# Patient Record
Sex: Male | Born: 1980 | Race: Black or African American | Hispanic: No | Marital: Single | Smoking: Current every day smoker
Health system: Southern US, Community
[De-identification: ages and names within clinical notes are randomized; demographics above are authoritative.]

## PROBLEM LIST (undated history)

## (undated) HISTORY — PX: FOOT SURGERY: SHX648

---

## 2012-06-09 ENCOUNTER — Emergency Department: Payer: Self-pay | Admitting: Emergency Medicine

## 2015-06-11 ENCOUNTER — Encounter (HOSPITAL_COMMUNITY): Payer: Self-pay | Admitting: Emergency Medicine

## 2015-06-11 ENCOUNTER — Emergency Department (HOSPITAL_COMMUNITY)
Admission: EM | Admit: 2015-06-11 | Discharge: 2015-06-12 | Disposition: A | Discharge status: Home / Self Care | Payer: Self-pay | Attending: Emergency Medicine | Admitting: Emergency Medicine

## 2015-06-11 ENCOUNTER — Emergency Department (HOSPITAL_COMMUNITY): Payer: Self-pay

## 2015-06-11 DIAGNOSIS — W228XXA Striking against or struck by other objects, initial encounter: Secondary | ICD-10-CM | POA: Insufficient documentation

## 2015-06-11 DIAGNOSIS — Z720 Tobacco use: Secondary | ICD-10-CM | POA: Insufficient documentation

## 2015-06-11 DIAGNOSIS — Y998 Other external cause status: Secondary | ICD-10-CM | POA: Insufficient documentation

## 2015-06-11 DIAGNOSIS — Y9289 Other specified places as the place of occurrence of the external cause: Secondary | ICD-10-CM | POA: Insufficient documentation

## 2015-06-11 DIAGNOSIS — S6992XA Unspecified injury of left wrist, hand and finger(s), initial encounter: Secondary | ICD-10-CM | POA: Insufficient documentation

## 2015-06-11 DIAGNOSIS — Y9389 Activity, other specified: Secondary | ICD-10-CM | POA: Insufficient documentation

## 2015-06-11 NOTE — ED Notes (Signed)
Patient transported to X-ray 

## 2015-06-11 NOTE — ED Notes (Signed)
Pt. presents with proximal left thumb pain/swelling injured after he accidentally hit it against a wall this evening .

## 2015-06-12 MED ORDER — HYDROCODONE-ACETAMINOPHEN 5-325 MG PO TABS: 1.0000 | ORAL_TABLET | Freq: Four times a day (QID) | ORAL | Status: AC | PRN

## 2015-06-12 MED ORDER — KETOCONAZOLE 2 % EX CREA: 1.0000 "application " | TOPICAL_CREAM | Freq: Every day | CUTANEOUS | Status: AC

## 2015-06-12 MED ORDER — HYDROCODONE-ACETAMINOPHEN 5-325 MG PO TABS
1.0000 | ORAL_TABLET | Freq: Once | ORAL | Status: AC
  Administered 2015-06-12: 1 via ORAL
  Filled 2015-06-12: qty 1

## 2015-06-12 NOTE — ED Provider Notes (Signed)
CSN: [id number]     Arrival date & time 06/11/15  2306 History  This chart was scribed for non-physician practitioner, Oswaldo ConroyVictoria Jerita Wimbush, PA-C, working with Purvis SheffieldForrest Harrison, MD, by Ronney LionSuzanne Le, ED Scribe. This patient was seen in room TR07C/TR07C and the patient's care was started at 12:10 AM.     Chief Complaint  Patient presents with  . Finger Injury   The history is provided by the patient. No language interpreter was used.    HPI Comments: Leonard Walsh is a 34 y.o. male who presents to the Emergency Department complaining of constant, severe, sharp left thumb pain and swelling that began 10 hours ago, after he accidentally slammed his thumb against the wall. He states touching it causes a sharp pain. Patient has not taken any treatments for this. He denies numbness, tingling, fever, chills, nausea, or vomiting   History reviewed. No pertinent past medical history. Past Surgical History  Procedure Laterality Date  . Foot surgery     No family history on file. History  Substance Use Topics  . Smoking status: Current Every Day Smoker  . Smokeless tobacco: Not on file  . Alcohol Use: Yes    Review of Systems  Constitutional: Negative for fever and chills.  Gastrointestinal: Negative for nausea and vomiting.  Musculoskeletal: Positive for myalgias (left thumb pain).  Neurological: Negative for dizziness and numbness.    Allergies  Review of patient's allergies indicates no known allergies.  Home Medications   Prior to Admission medications   Medication Sig Start Date End Date Taking? Authorizing Provider  HYDROcodone-acetaminophen (NORCO/VICODIN) 5-325 MG per tablet Take 1 tablet by mouth every 6 (six) hours as needed. 06/12/15   Oswaldo ConroyVictoria Sequoyah Ramone, PA-C  ketoconazole (NIZORAL) 2 % cream Apply 1 application topically daily. 06/12/15   Oswaldo ConroyVictoria Maryon Kemnitz, PA-C   BP 125/69 mmHg  Pulse 67  Temp(Src) 98.3 F (36.8 C) (Oral)  Resp 16  Ht 5\' 9"  (1.753 m)  Wt 175 lb (79.379 kg)  BMI  25.83 kg/m2  SpO2 99% Physical Exam  Constitutional: He appears well-developed and well-nourished. No distress.  HENT:  Head: Normocephalic and atraumatic.  Eyes: Conjunctivae are normal. Right eye exhibits no discharge. Left eye exhibits no discharge.  Cardiovascular:  Pulses:      Radial pulses are 2+ on the right side, and 2+ on the left side.  Pulmonary/Chest: Effort normal. No respiratory distress.  Musculoskeletal: He exhibits tenderness.  2+ radial pulses equal bilaterally. Erythema and swelling to left thenar process, with tenderness. Intact flexion and extension of all fingers, pain worse with extension of thumb. No wrist tenderness. Supination and pronation intact without significant pain. No tenderness over flexor sheath or to diffuse left thumb.   Neurological: He is alert. Coordination normal.  Skin: He is not diaphoretic.  Psychiatric: He has a normal mood and affect. His behavior is normal.  Nursing note and vitals reviewed.   ED Course  Procedures (including critical care time)  DIAGNOSTIC STUDIES: Oxygen Saturation is 98% on RA, normal by my interpretation.    COORDINATION OF CARE: 12:12 AM - Discussed treatment plan with pt at bedside which includes left hand XR, placement in a split, and f/u with orthopedist. Will also administer pain medication here today. Pt verbalized understanding and agreed to plan.  MDM   Final diagnoses:  Thumb injury, left, initial encounter   It has been determined that no acute conditions requiring further emergency intervention are present at this time. The patient have been advised of the  diagnosis and plan. We have discussed signs and symptoms that warrant return to the ED, such as changes or worsening in symptoms.  I personally performed the services described in this documentation, which was scribed in my presence. The recorded information has been reviewed and is accurate.   Oswaldo Conroy, PA-C 06/12/15 0150  Purvis Sheffield, MD 06/12/15 [id number]

## 2015-06-12 NOTE — ED Notes (Signed)
Pt verbalizes understanding of d/c instructions and denies any further needs at this time. 

## 2015-06-12 NOTE — Discharge Instructions (Signed)
Return to the emergency room with worsening of symptoms, new symptoms or with symptoms that are concerning, especially redness, swelling, numbness, tingling, unable to move thumb. RICE: Rest, Ice (three cycles of 20 mins on, 20mins off at least twice a day), compression/brace, elevation. Heating pad works well for back pain. Ibuprofen 400mg  (2 tablets 200mg ) every 5-6 hours for 3-5 days. norco for severe pain. Do not operate machinery, drive or drink alcohol while taking narcotics or muscle relaxers. Call to make follow up appointment. Read below information and follow recommendations. Hand Contusion A hand contusion is a deep bruise on your hand area. Contusions are the result of an injury that caused bleeding under the skin. The contusion may turn blue, purple, or yellow. Minor injuries will give you a painless contusion, but more severe contusions may stay painful and swollen for a few weeks. CAUSES  A contusion is usually caused by a blow, trauma, or direct force to an area of the body. SYMPTOMS   Swelling and redness of the injured area.  Discoloration of the injured area.  Tenderness and soreness of the injured area.  Pain. DIAGNOSIS  The diagnosis can be made by taking a history and performing a physical exam. An X-ray, CT scan, or MRI may be needed to determine if there were any associated injuries, such as broken bones (fractures). TREATMENT  Often, the best treatment for a hand contusion is resting, elevating, icing, and applying cold compresses to the injured area. Over-the-counter medicines may also be recommended for pain control. HOME CARE INSTRUCTIONS   Put ice on the injured area.  Put ice in a plastic bag.  Place a towel between your skin and the bag.  Leave the ice on for 15-20 minutes, 03-04 times a day.  Only take over-the-counter or prescription medicines as directed by your caregiver. Your caregiver may recommend avoiding anti-inflammatory medicines (aspirin,  ibuprofen, and naproxen) for 48 hours because these medicines may increase bruising.  If told, use an elastic wrap as directed. This can help reduce swelling. You may remove the wrap for sleeping, showering, and bathing. If your fingers become numb, cold, or blue, take the wrap off and reapply it more loosely.  Elevate your hand with pillows to reduce swelling.  Avoid overusing your hand if it is painful. SEEK IMMEDIATE MEDICAL CARE IF:   You have increased redness, swelling, or pain in your hand.  Your swelling or pain is not relieved with medicines.  You have loss of feeling in your hand or are unable to move your fingers.  Your hand turns cold or blue.  You have pain when you move your fingers.  Your hand becomes warm to the touch.  Your contusion does not improve in 2 days. MAKE SURE YOU:   Understand these instructions.  Will watch your condition.  Will get help right away if you are not doing well or get worse. Document Released: 05/18/2002 Document Revised: 08/20/2012 Document Reviewed: 05/19/2012 [facility] Patient Information 2015 [employer] [place] [place]. This information is not intended to replace advice given to you by your health care provider. Make sure you discuss any questions you have with your health care provider.

## 2015-06-12 NOTE — Progress Notes (Signed)
Orthopedic Tech Progress Note Patient Details:  Leonard Walsh 11/27/1981 [id number]  Ortho Devices Type of Ortho Device: Ace wrap, Thumb spica splint Splint Material: Fiberglass Ortho Device/Splint Interventions: Application   Cammer, Mickie BailJennifer Carol 06/12/2015, 2:06 AM

## 2015-11-26 ENCOUNTER — Encounter (HOSPITAL_COMMUNITY): Payer: Self-pay | Admitting: Emergency Medicine

## 2015-11-26 ENCOUNTER — Emergency Department (HOSPITAL_COMMUNITY)
Admission: EM | Admit: 2015-11-26 | Discharge: 2015-11-26 | Disposition: A | Discharge status: Home / Self Care | Payer: Self-pay | Attending: Emergency Medicine | Admitting: Emergency Medicine

## 2015-11-26 ENCOUNTER — Emergency Department (HOSPITAL_COMMUNITY): Payer: Self-pay

## 2015-11-26 DIAGNOSIS — J029 Acute pharyngitis, unspecified: Secondary | ICD-10-CM | POA: Insufficient documentation

## 2015-11-26 DIAGNOSIS — R42 Dizziness and giddiness: Secondary | ICD-10-CM | POA: Insufficient documentation

## 2015-11-26 DIAGNOSIS — R059 Cough, unspecified: Secondary | ICD-10-CM

## 2015-11-26 DIAGNOSIS — Z79899 Other long term (current) drug therapy: Secondary | ICD-10-CM | POA: Insufficient documentation

## 2015-11-26 DIAGNOSIS — R0602 Shortness of breath: Secondary | ICD-10-CM | POA: Insufficient documentation

## 2015-11-26 DIAGNOSIS — R05 Cough: Secondary | ICD-10-CM | POA: Insufficient documentation

## 2015-11-26 DIAGNOSIS — F17200 Nicotine dependence, unspecified, uncomplicated: Secondary | ICD-10-CM | POA: Insufficient documentation

## 2015-11-26 NOTE — ED Provider Notes (Signed)
CSN: [id number]     Arrival date & time 11/26/15  1048 History   First MD Initiated Contact with Patient 11/26/15 1226     Chief Complaint  Patient presents with  . Nasal Congestion  . Sore Throat     (Consider location/radiation/quality/duration/timing/severity/associated sxs/prior Treatment) HPI Comments: Patients with cough for the past 10 days, currently undergoing treatment for bronchitis with Augmentin and Tessalon -- presents after having an episode of chest tightness, shortness of breath, anxiety this morning lasting approximately 2 hours. Patient felt lightheaded but did not pass out. No chest pains, fever. Patient has had anxiety attacks in the past and states that this felt similar to him. Symptoms gradually resolved. He was concerned also about having an allergic reaction to his medications. Symptoms started approximately 5:30 AM. Patient last took Augmentin late last night and Tessalon approximately 2 AM. Symptoms were not accompanied by hives, vomiting, diarrhea, syncope. Patient has no history of allergic reactions to any foods or medications. Onset of symptoms acute. Course is resolved. Nothing makes symptoms better or worse.  Patient is a 34 y.o. male presenting with pharyngitis. The history is provided by the patient.  Sore Throat Associated symptoms include coughing and a sore throat. Pertinent negatives include no abdominal pain, chills, fatigue, fever, headaches, myalgias, nausea, rash or vomiting.    History reviewed. No pertinent past medical history. Past Surgical History  Procedure Laterality Date  . Foot surgery     History reviewed. No pertinent family history. Social History  Substance Use Topics  . Smoking status: Current Every Day Smoker  . Smokeless tobacco: None  . Alcohol Use: Yes    Review of Systems  Constitutional: Negative for fever, chills and fatigue.  HENT: Positive for sore throat. Negative for ear pain, rhinorrhea and sinus pressure.    Eyes: Negative for redness.  Respiratory: Positive for cough and shortness of breath. Negative for wheezing.   Gastrointestinal: Negative for nausea, vomiting, abdominal pain and diarrhea.  Genitourinary: Negative for dysuria.  Musculoskeletal: Negative for myalgias and neck stiffness.  Skin: Negative for rash.  Neurological: Positive for light-headedness. Negative for syncope and headaches.  Hematological: Negative for adenopathy.      Allergies  Review of patient's allergies indicates no known allergies.  Home Medications   Prior to Admission medications   Medication Sig Start Date End Date Taking? Authorizing Provider  HYDROcodone-acetaminophen (NORCO/VICODIN) 5-325 MG per tablet Take 1 tablet by mouth every 6 (six) hours as needed. 06/12/15   Oswaldo Conroy, PA-C  ketoconazole (NIZORAL) 2 % cream Apply 1 application topically daily. 06/12/15   Oswaldo Conroy, PA-C   BP 113/68 mmHg  Pulse 88  Temp(Src) 98.8 F (37.1 C) (Oral)  Resp 18  SpO2 97% Physical Exam  Constitutional: He appears well-developed and well-nourished.  HENT:  Head: Normocephalic and atraumatic.  Right Ear: Tympanic membrane, external ear and ear canal normal.  Left Ear: Tympanic membrane, external ear and ear canal normal.  Nose: Nose normal. No mucosal edema or rhinorrhea.  Mouth/Throat: Uvula is midline and mucous membranes are normal. Mucous membranes are not dry. No trismus in the jaw. No uvula swelling. Posterior oropharyngeal erythema present. No oropharyngeal exudate, posterior oropharyngeal edema or tonsillar abscesses.  Eyes: Conjunctivae are normal. Right eye exhibits no discharge. Left eye exhibits no discharge.  Neck: Normal range of motion. Neck supple.  Cardiovascular: Normal rate, regular rhythm and normal heart sounds.   Pulmonary/Chest: Effort normal and breath sounds normal. No respiratory distress. He has no wheezes.  He has no rales.  Abdominal: Soft. There is no tenderness.   Neurological: He is alert.  Skin: Skin is warm and dry. No rash noted.  Psychiatric: He has a normal mood and affect.  Nursing note and vitals reviewed.   ED Course  Procedures (including critical care time) Labs Review Labs Reviewed - No data to display  Imaging Review Dg Chest 2 View  11/26/2015  CLINICAL DATA:  Cough for 10 days EXAM: CHEST - 2 VIEW COMPARISON:  None. FINDINGS: The heart size and mediastinal contours are within normal limits. Both lungs are clear. The visualized skeletal structures are unremarkable. IMPRESSION: No active disease. Electronically Signed   By: Alcide CleverMark  Lukens M.D.   On: 11/26/2015 12:21   I have personally reviewed and evaluated these images and lab results as part of my medical decision-making.   EKG Interpretation None       12:44 PM Patient seen and examined. Patient updated on results.  Vital signs reviewed and are as follows: BP 113/68 mmHg  Pulse 88  Temp(Src) 98.8 F (37.1 C) (Oral)  Resp 18  SpO2 97%  Discuss possible intolerance to Augmentin versus Tessalon. I do not think that this represents an anaphylactic reaction. Patient counseled on the risks and benefits of these medications versus his current symptoms. I will allow him to decide if he wants to continue these medications or not. We discussed that if he did develop a severe reaction such as lip swelling, difficulty breathing -- that he should call 911.  Counseled on typical course of bronchitis.  MDM   Final diagnoses:  Cough   Patient with shortness of breath 2 hours this morning. No risk factors for PE. He has had cough for the past 10 days. Chest x-ray is clear today. Currently undergoing treatment for bronchitis. Symptoms are not very suspicious for anaphylactic reaction, especially since these did not occur in close proximity to ingestion of medication. Patient did not have any hives or angioedema. Patient appears well. He does seem a little anxious. Otherwise  well-appearing, no indications for observation admission at this time.   Renne CriglerJoshua Tysean Vandervliet, PA-C 11/26/15 1257  Azalia BilisKevin Campos, MD 11/26/15 1320

## 2015-11-26 NOTE — Discharge Instructions (Signed)
Please read and follow all provided instructions.  Your diagnoses today include:  1. Cough    Tests performed today include:  Chest x-ray - does not show any pneumonia or other problems  Vital signs. See below for your results today.   Medications prescribed:   None  Take any prescribed medications only as directed.  Home care instructions:  Follow any educational materials contained in this packet.  Follow-up instructions: Please follow-up with your primary care provider in the next 3 days for further evaluation of your symptoms and a recheck if you are not feeling better.   Return instructions:   Please return to the Emergency Department if you experience worsening symptoms.  Please return with worsening wheezing, shortness of breath, or difficulty breathing.  Return with persistent fever above 101F.   Please return if you have any other emergent concerns.  Additional Information:  Your vital signs today were: BP 113/68 mmHg   Pulse 88   Temp(Src) 98.8 F (37.1 C) (Oral)   Resp 18   SpO2 97% If your blood pressure (BP) was elevated above 135/85 this visit, please have this repeated by your doctor within one month. --------------

## 2015-11-26 NOTE — ED Notes (Signed)
Patient transported to X-ray 

## 2015-11-26 NOTE — ED Notes (Signed)
Pt sts congestion and sore throat x 4 days; [facility] and given meds but not feeling better

## 2015-11-27 IMAGING — DX DG FINGER THUMB 2+V*L*
3 series · 3 of 3 positions shown · non-contrast
Comparison: None.

CLINICAL DATA: Hit left thumb on wall, with pain at the left first
metacarpophalangeal joint. Initial encounter.

EXAM:
LEFT THUMB 2+V

[finger ap]
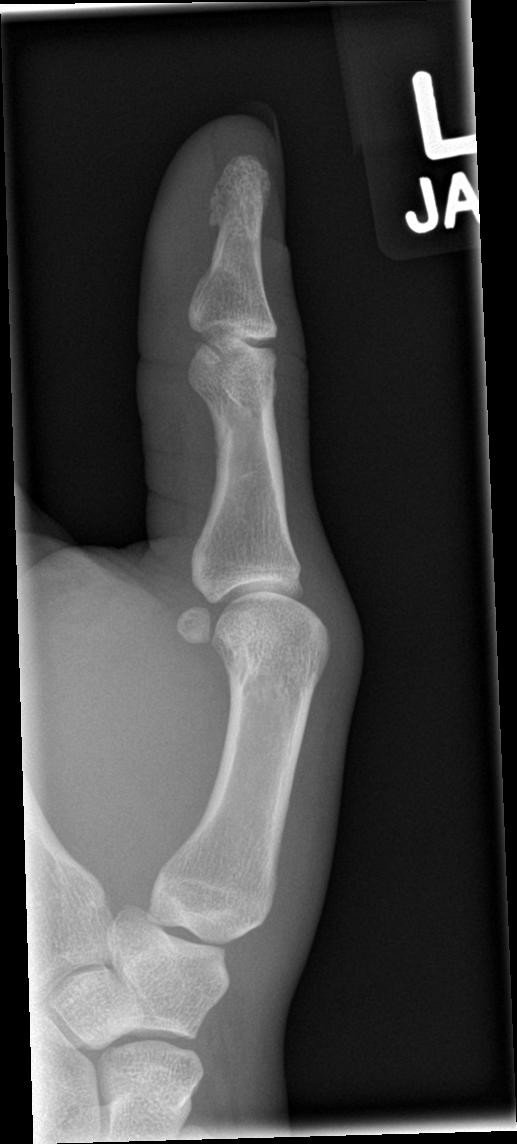

[finger obl]
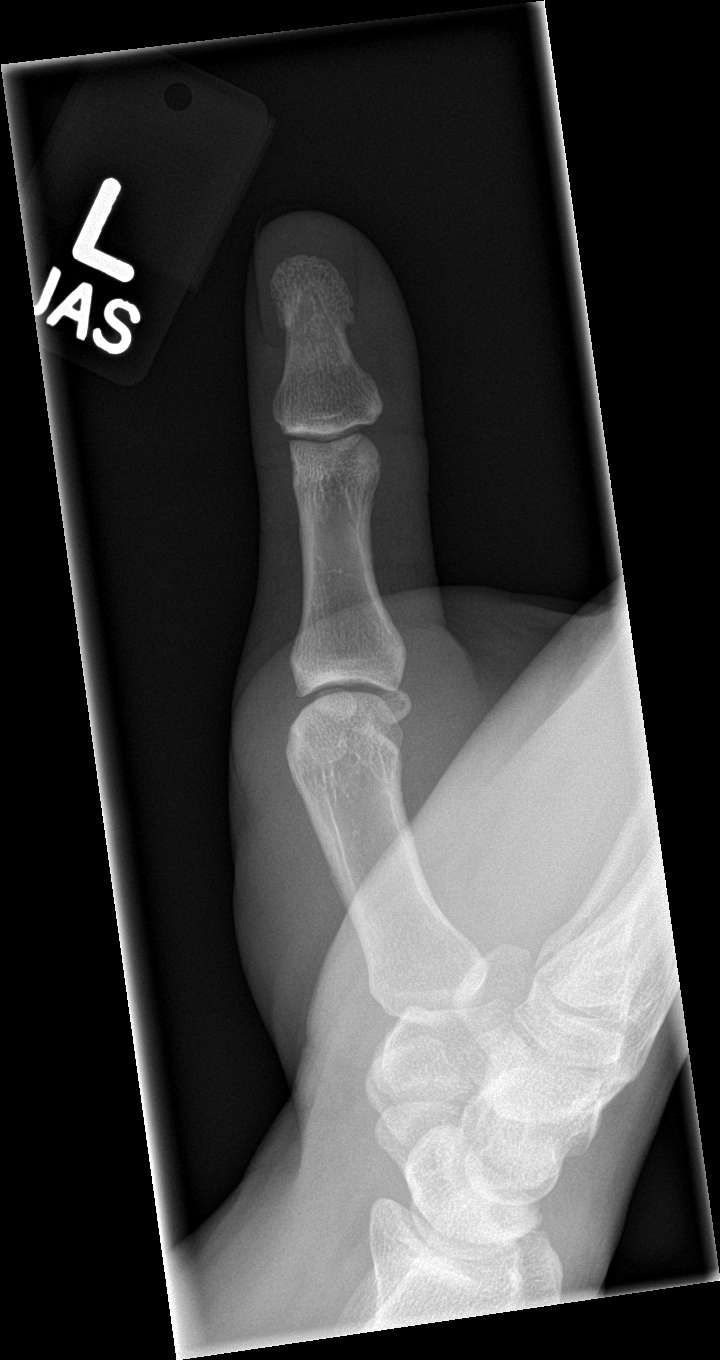

[finger lat]
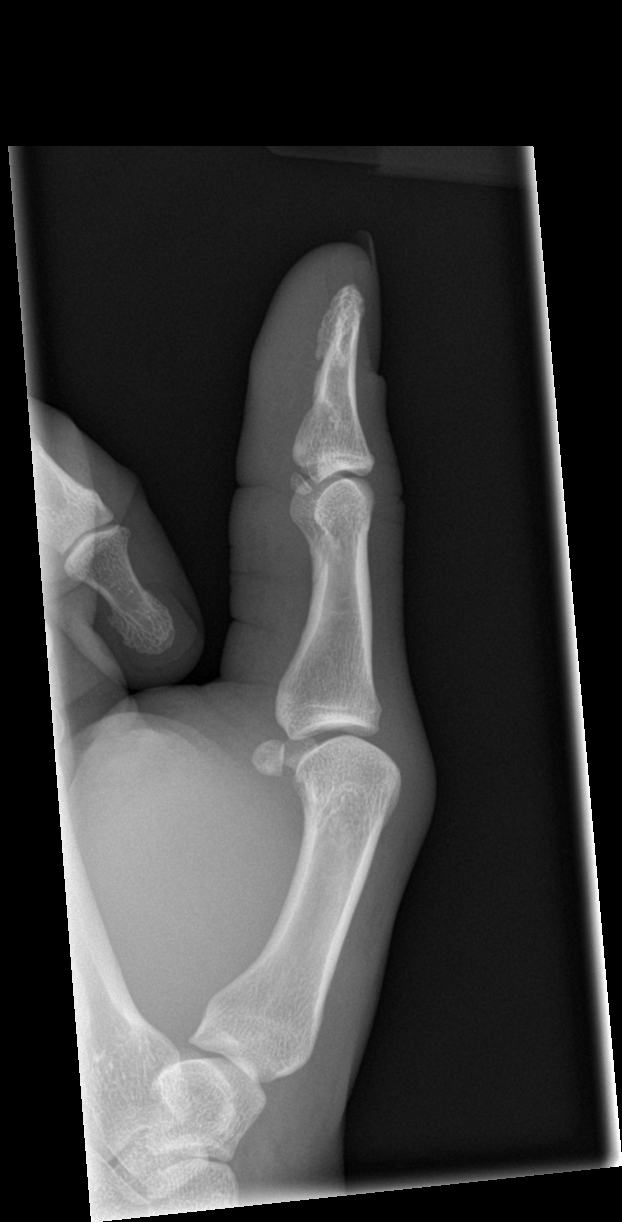

[3 of 3 positions shown; findings below may reference images not displayed]

FINDINGS: There is no evidence of fracture or dislocation. The left first
metacarpophalangeal joint is grossly unremarkable appearance.
Visualized joint spaces are preserved. No definite soft tissue
abnormalities are characterized on radiograph.
IMPRESSION: No evidence of fracture or dislocation.

## 2016-05-13 IMAGING — CR DG CHEST 2V
2 series · 2 of 2 positions shown · non-contrast
Comparison: None.

CLINICAL DATA: Cough for 10 days

EXAM:
CHEST - 2 VIEW

[chest pa]
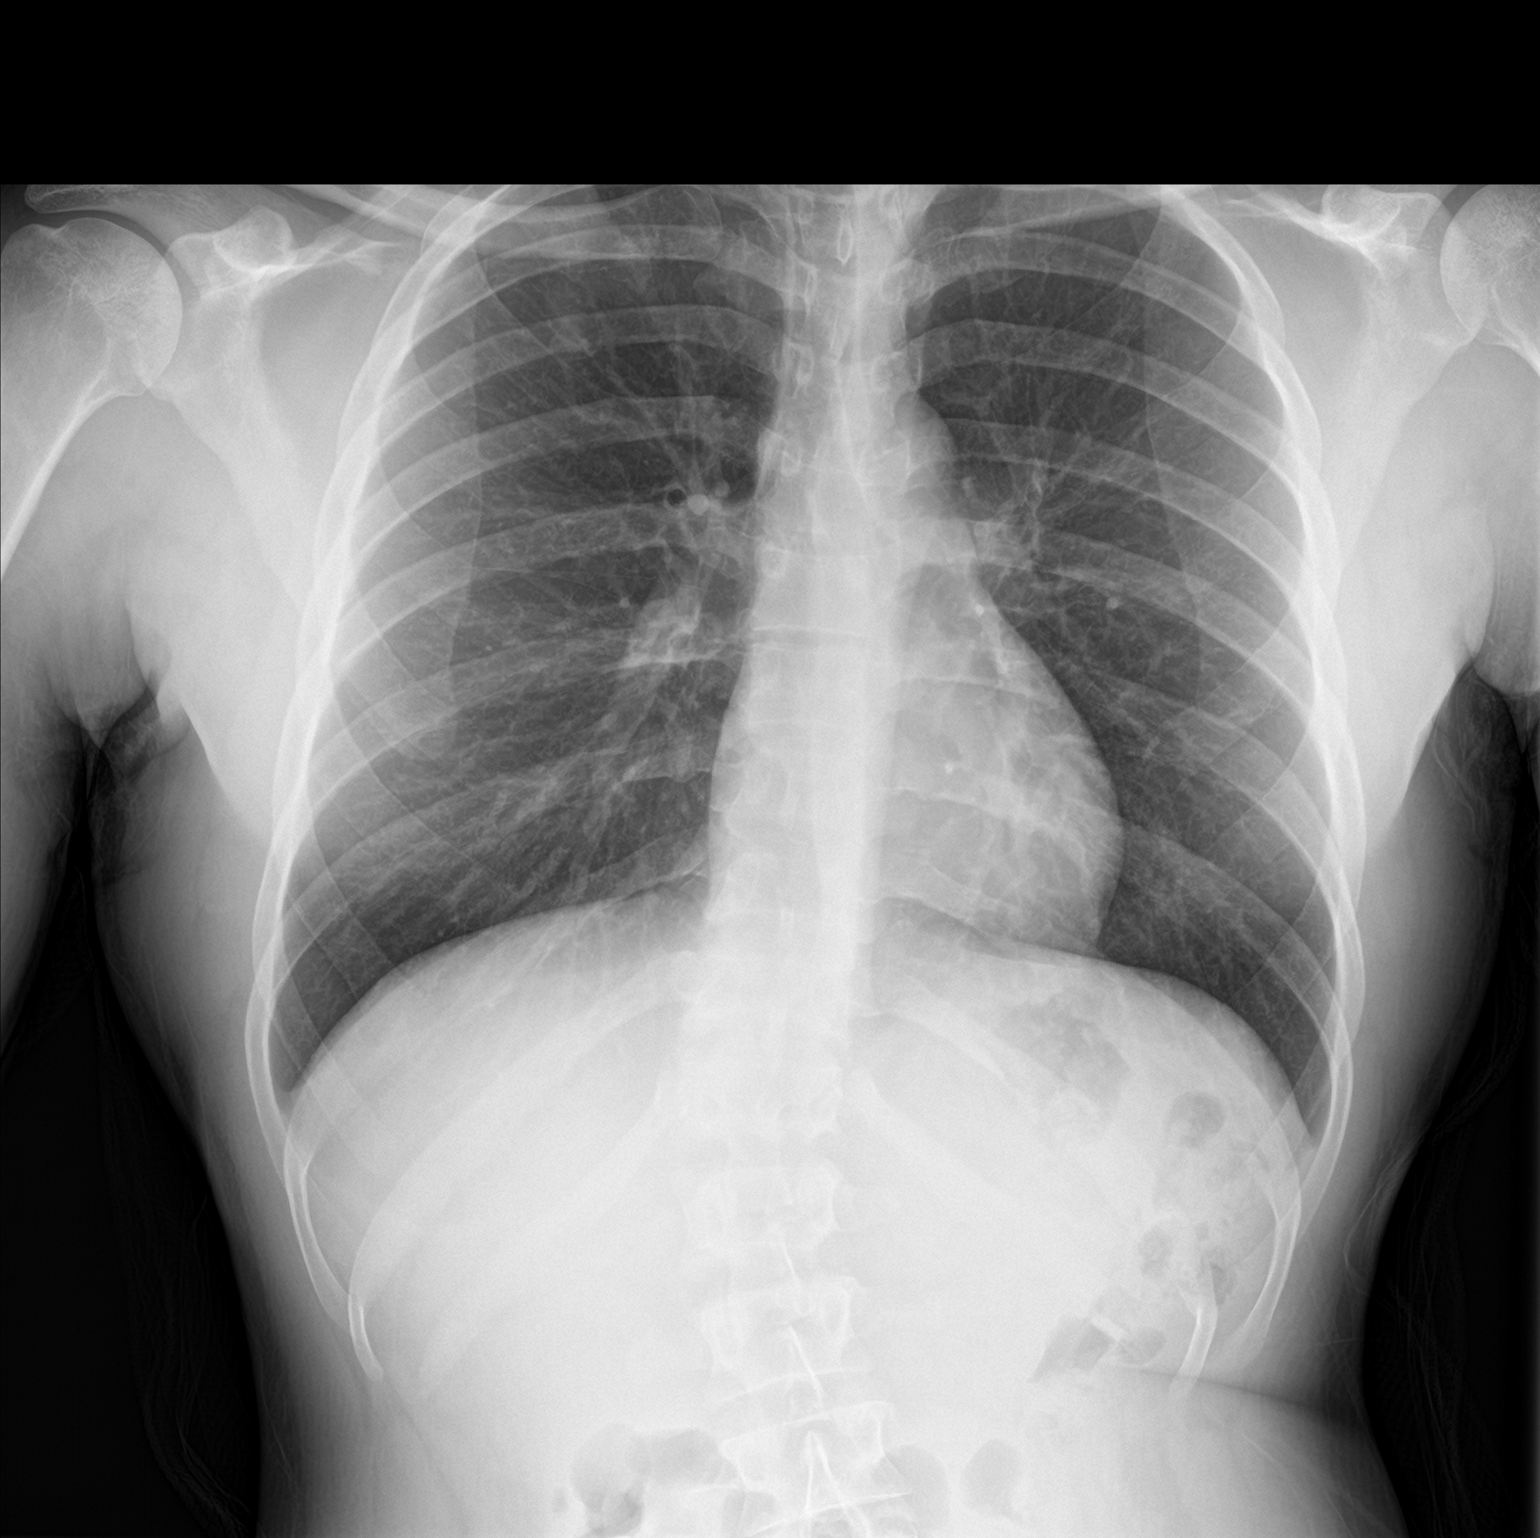

[chest lat]
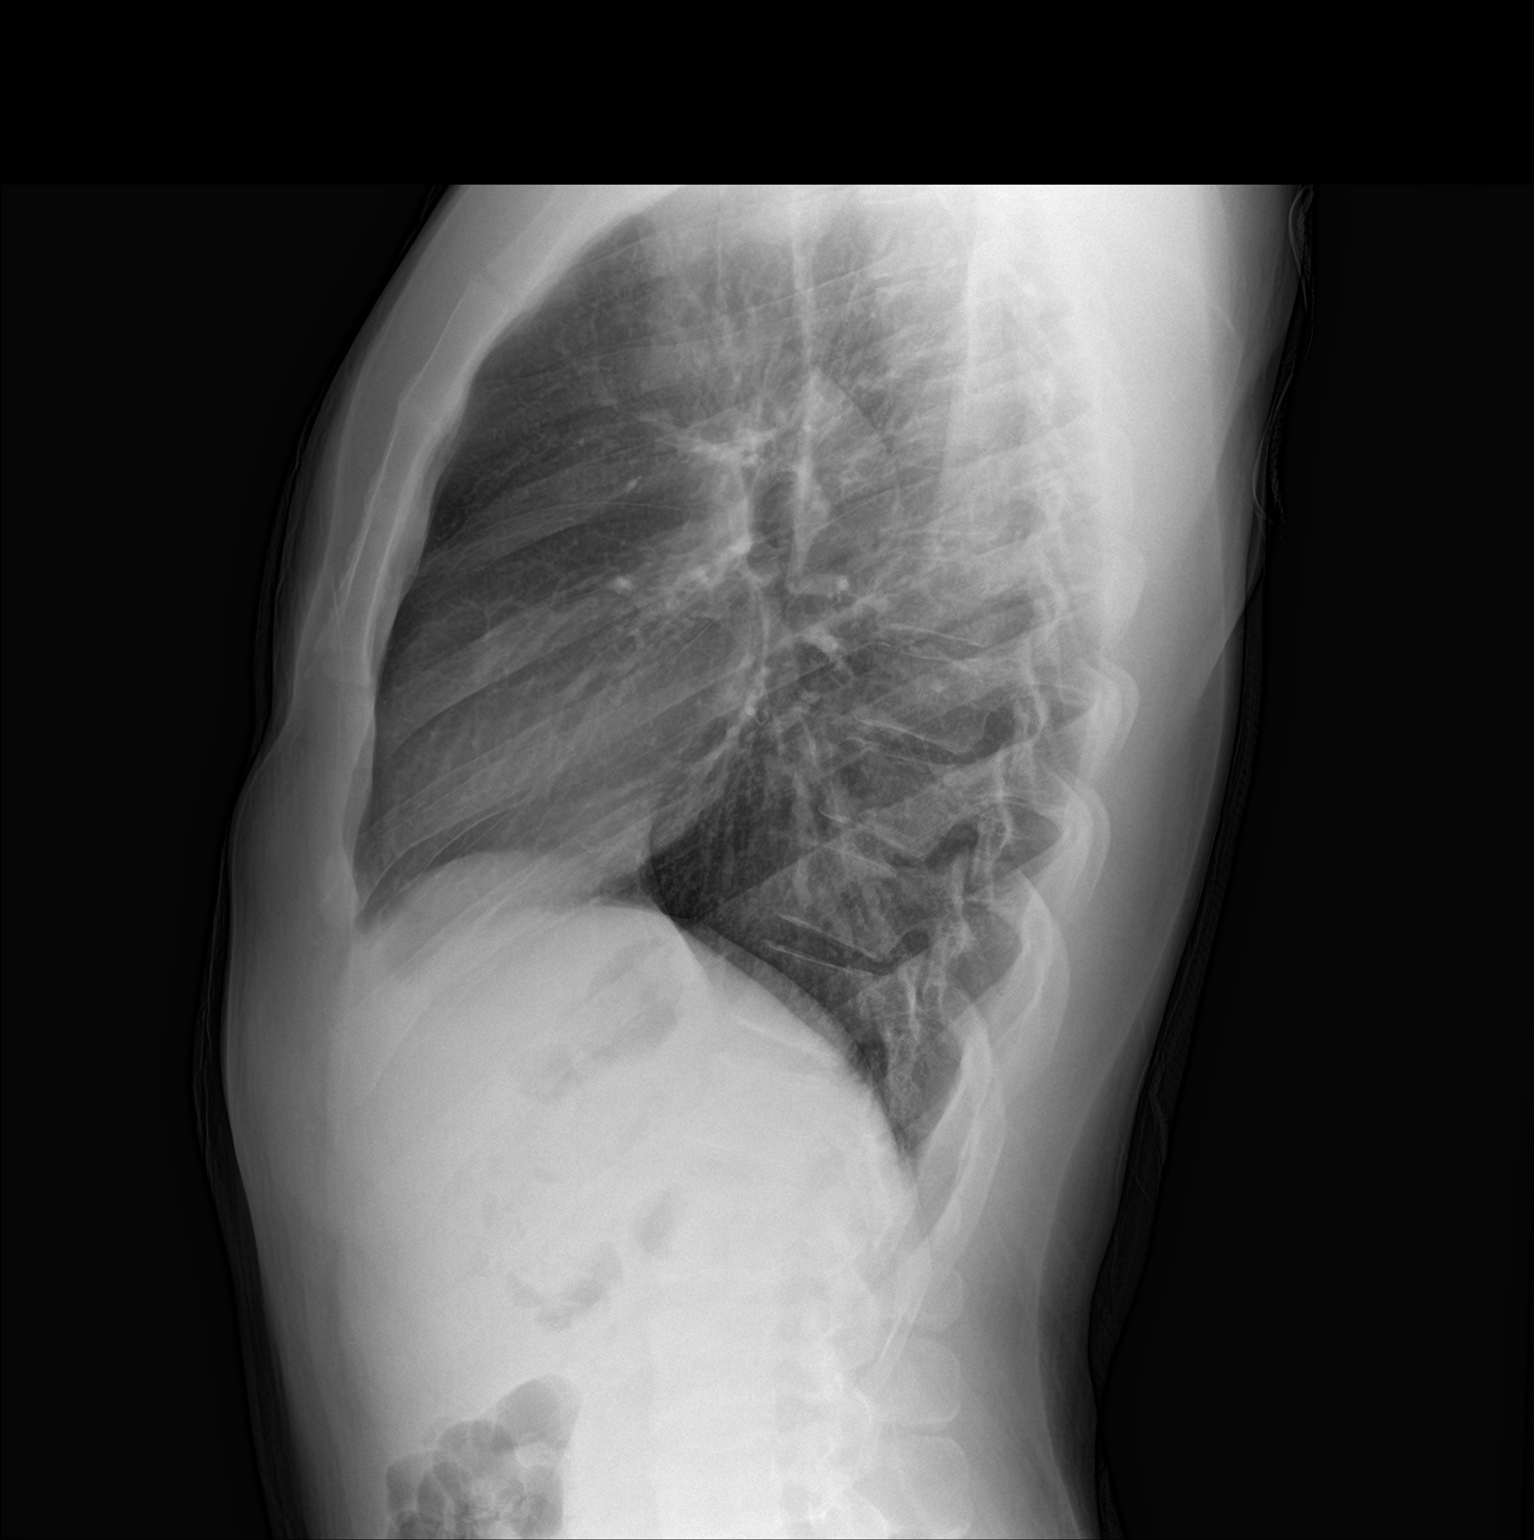

[2 of 2 positions shown; findings below may reference images not displayed]

FINDINGS: The heart size and mediastinal contours are within normal limits.
Both lungs are clear. The visualized skeletal structures are
unremarkable.
IMPRESSION: No active disease.

## 2018-09-17 LAB — HM HIV SCREENING LAB: HM HIV Screening: NEGATIVE

## 2020-03-22 ENCOUNTER — Ambulatory Visit: Payer: Self-pay

## 2020-05-13 ENCOUNTER — Encounter: Payer: Self-pay | Admitting: Physician Assistant

## 2020-05-13 ENCOUNTER — Other Ambulatory Visit: Payer: Self-pay

## 2020-05-13 ENCOUNTER — Ambulatory Visit: Payer: Self-pay | Admitting: Physician Assistant

## 2020-05-13 DIAGNOSIS — Z299 Encounter for prophylactic measures, unspecified: Secondary | ICD-10-CM

## 2020-05-13 DIAGNOSIS — Z113 Encounter for screening for infections with a predominantly sexual mode of transmission: Secondary | ICD-10-CM

## 2020-05-13 LAB — GRAM STAIN

## 2020-05-13 MED ORDER — AZITHROMYCIN 500 MG PO TABS
1000.0000 mg | ORAL_TABLET | Freq: Once | ORAL | Status: AC
  Administered 2020-05-13: 1000 mg via ORAL

## 2020-05-13 NOTE — Progress Notes (Signed)
Gram stain reviewed with provider and is negative today. Pt received Azithromycin 1g po DOT per provider order. Counseled pt per provider orders and pt states understanding. Provider orders completed. 

## 2020-05-13 NOTE — Progress Notes (Signed)
Pt here for STD screening. 

## 2020-05-14 NOTE — Progress Notes (Signed)
   [facility] Department STI clinic/screening visit  Subjective:  Leonard Walsh is a 39 y.o. male being seen today for an STI screening visit. The patient reports they do have symptoms.    Patient has the following medical conditions:  There are no problems to display for this patient.    Chief Complaint  Patient presents with  . SEXUALLY TRANSMITTED DISEASE    screening    HPI  Patient reports that he has had a white discharge for 1 week.  Denies other symptoms, chronic conditions and regular medications.  Patient states last HIV test was in 2020.   See flowsheet for further details and programmatic requirements.    The following portions of the patient's history were reviewed and updated as appropriate: allergies, current medications, past medical history, past social history, past surgical history and problem list.  Objective:  There were no vitals filed for this visit.  Physical Exam Constitutional:      General: He is not in acute distress.    Appearance: Normal appearance.  HENT:     Head: Normocephalic and atraumatic.     Comments: No nits, lice, or hair loss. No cervical, supraclavicular or axillary adenopathy.    Mouth/Throat:     Mouth: Mucous membranes are moist.     Pharynx: Oropharynx is clear. No oropharyngeal exudate or posterior oropharyngeal erythema.  Eyes:     Conjunctiva/sclera: Conjunctivae normal.  Pulmonary:     Effort: Pulmonary effort is normal.  Abdominal:     Palpations: Abdomen is soft. There is no mass.     Tenderness: There is no abdominal tenderness. There is no guarding or rebound.  Genitourinary:    Penis: Normal.      Testes: Normal.     Comments: Pubic area without nits, lice, edema, erythema, lesions and inguinal adenopathy. Penis circumcised without rash, lesions and inguinal adenopathy. Musculoskeletal:     Cervical back: Neck supple. No tenderness.  Skin:    General: Skin is warm and dry.     Findings:  No bruising, erythema, lesion or rash.  Neurological:     Mental Status: He is alert and oriented to person, place, and time.  Psychiatric:        Mood and Affect: Mood normal.        Behavior: Behavior normal.        Thought Content: Thought content normal.        Judgment: Judgment normal.       Assessment and Plan:  Leonard Walsh Walsh is a 39 y.o. male presenting to the [facility] Department for STI screening  1. Screening for STD (sexually transmitted disease) Patient into clinic with symptoms. Rec condoms with all sex. Await test results.  Counseled that RN will call if needs to RTC for further treatment once results are back.  - Gram stain - Gonococcus culture - HIV [facility] - Syphilis Serology, [facility]  2. Prophylactic measure Will treat with Azithromycin 1g po DOT due to patient symptoms. No sex for 7 days and until after partner completes treatment. RTC for re-treatment if vomits < 2 hr after taking medicine. - azithromycin (ZITHROMAX) tablet 1,000 mg     No follow-ups on file.  No future appointments.  Matt Holmes, PA

## 2020-05-18 LAB — GONOCOCCUS CULTURE

## 2021-05-19 ENCOUNTER — Other Ambulatory Visit: Payer: Self-pay

## 2021-05-19 ENCOUNTER — Encounter: Payer: Self-pay | Admitting: Physician Assistant

## 2021-05-19 ENCOUNTER — Ambulatory Visit: Payer: Self-pay | Admitting: Physician Assistant

## 2021-05-19 DIAGNOSIS — Z113 Encounter for screening for infections with a predominantly sexual mode of transmission: Secondary | ICD-10-CM

## 2021-05-19 LAB — GRAM STAIN

## 2021-05-19 MED ORDER — DOXYCYCLINE HYCLATE 100 MG PO TABS: 100.0000 mg | ORAL_TABLET | Freq: Two times a day (BID) | ORAL | 0 refills | Status: DC

## 2021-05-19 NOTE — Progress Notes (Signed)
   [facility] Department STI clinic/screening visit  Subjective:  JAMUS LOVING II is a 40 y.o. male being seen today for an STI screening visit. The patient reports they do have symptoms.    Patient has the following medical conditions:  There are no problems to display for this patient.    Chief Complaint  Patient presents with   SEXUALLY TRANSMITTED DISEASE    screening    HPI  Patient reports that he has had a white/yellowish discharge off and on for 2-3 weeks.  Denies chronic conditions and regular medicines.  States last HIV test was last year and last void prior to sample collection for Gram stain was about 45 minutes ago.     See flowsheet for further details and programmatic requirements.    The following portions of the patient's history were reviewed and updated as appropriate: allergies, current medications, past medical history, past social history, past surgical history and problem list.  Objective:  There were no vitals filed for this visit.  Physical Exam Constitutional:      General: He is not in acute distress.    Appearance: Normal appearance.  HENT:     Head: Normocephalic and atraumatic.     Comments: No nits,lice, or hair loss. No cervical, supraclavicular or axillary adenopathy.     Mouth/Throat:     Mouth: Mucous membranes are moist.     Pharynx: Oropharynx is clear. No oropharyngeal exudate or posterior oropharyngeal erythema.  Eyes:     Conjunctiva/sclera: Conjunctivae normal.  Pulmonary:     Effort: Pulmonary effort is normal.  Abdominal:     Palpations: Abdomen is soft. There is no mass.     Tenderness: There is no abdominal tenderness. There is no guarding or rebound.  Genitourinary:    Penis: Normal.      Testes: Normal.     Comments: Pubic area without nits, lice, hair loss, edema, erythema, lesions and inguinal adenopathy. Penis circumcised without rash, lesions and discharge at meatus. Testicles descended  bilaterally,nt, no masses or edema.  Musculoskeletal:     Cervical back: Neck supple. No tenderness.  Skin:    General: Skin is warm and dry.     Findings: No bruising, erythema, lesion or rash.  Neurological:     Mental Status: He is alert and oriented to person, place, and time.  Psychiatric:        Mood and Affect: Mood normal.        Behavior: Behavior normal.        Thought Content: Thought content normal.        Judgment: Judgment normal.      Assessment and Plan:  AMMAN BARTEL II is a 40 y.o. male presenting to the [facility] Department for STI screening  1. Screening for STD (sexually transmitted disease) Patient into clinic with symptoms. Reviewed with patient Gram stain results and counseled that the accuracy of the results is affected by recent urination. Counseled patient and offered treatment with Doxycycline as a prophylactic  measure.  Patient declines and requests appointment first am on Monday for repeat Gram stain. Rec condoms with all sex. Await test results.  Counseled that RN will call if needs to RTC for treatment once results are back.  - Gram stain - Gonococcus culture - HIV [place] [facility] - Syphilis Serology, [facility] - Gonococcus culture     No follow-ups on file.  No future appointments.  Matt Holmes, PA

## 2021-05-22 ENCOUNTER — Ambulatory Visit: Payer: Self-pay | Admitting: Physician Assistant

## 2021-05-22 DIAGNOSIS — N341 Nonspecific urethritis: Secondary | ICD-10-CM

## 2021-05-22 DIAGNOSIS — Z113 Encounter for screening for infections with a predominantly sexual mode of transmission: Secondary | ICD-10-CM

## 2021-05-22 LAB — GRAM STAIN

## 2021-05-22 MED ORDER — DOXYCYCLINE HYCLATE 100 MG PO TABS: 100.0000 mg | ORAL_TABLET | Freq: Two times a day (BID) | ORAL | 0 refills | Status: AC

## 2021-05-22 NOTE — Progress Notes (Signed)
S:  patient returns to clinic for a repeat Gram stain.  Patient was seen on 05/19/2021 for an IS visit and had urinated just prior to sample collection.  Per patient there have not been any changes to his history since Friday.  Reports last void prior to sample collection for Gram stain was over 2 hr ago. O:  WDWN male in NAD, A&O x 3, normal work of breathing; Pubic area without nits, lice, hair loss, edema, erythema, lesions and inguinal adenopathy. Penis circumcised without rash or lesions. Small amount of clear discharge at meatus. Testicles descended bilaterally,nt, no masses or edema.  Gram stain= > 2 WBC/hpf; intracellular GND absent. A/P:  1.  Patient needs treatment for NGU. 2.  Counseled patient re: NGU and multiple causes. 3.  Will treat with Doxycycline 100 mg #14 1 po BID for 7 days.  The patient was dispensed Docycycline today. I provided counseling today regarding the medication. We discussed the medication, the side effects and when to call clinic. Patient given the opportunity to ask questions. Questions answered.   4.  No sex for 14 days and until after partner completes treatment. 5.  Condoms enc with all sex for STD protection. 6.  Call with questions or concerns.

## 2021-05-24 LAB — GONOCOCCUS CULTURE

## 2021-05-27 NOTE — Progress Notes (Signed)
Chart reviewed by Pharmacist  Suzanne Walker PharmD, Contract Pharmacist at [facility]  

## 2021-06-19 ENCOUNTER — Telehealth: Payer: Self-pay | Admitting: Family Medicine

## 2021-06-19 NOTE — Telephone Encounter (Signed)
Phone call to pt. Left message that RN with [facility] is returning phone call, if still need assistance please call us back at [phone].

## 2021-06-20 NOTE — Telephone Encounter (Signed)
Phone call to pt. Left message that [facility] is calling him back, if still needs assistance please call [phone].

## 2021-06-20 NOTE — Telephone Encounter (Signed)
Phone call to pt. Left message on voicemail that RN with [facility] is returning phone call, if still need assistance please call us back at [phone].

## 2021-07-19 ENCOUNTER — Other Ambulatory Visit: Payer: Self-pay

## 2021-07-19 ENCOUNTER — Ambulatory Visit: Payer: Self-pay | Admitting: Physician Assistant

## 2021-07-19 DIAGNOSIS — Z113 Encounter for screening for infections with a predominantly sexual mode of transmission: Secondary | ICD-10-CM

## 2021-07-19 DIAGNOSIS — N341 Nonspecific urethritis: Secondary | ICD-10-CM

## 2021-07-19 DIAGNOSIS — Z299 Encounter for prophylactic measures, unspecified: Secondary | ICD-10-CM

## 2021-07-19 LAB — GRAM STAIN

## 2021-07-19 MED ORDER — METRONIDAZOLE 500 MG PO TABS: 2000.0000 mg | ORAL_TABLET | Freq: Once | ORAL | 0 refills | Status: AC

## 2021-07-19 MED ORDER — DOXYCYCLINE HYCLATE 100 MG PO TABS: 100.0000 mg | ORAL_TABLET | Freq: Two times a day (BID) | ORAL | 0 refills | Status: AC

## 2021-07-19 NOTE — Progress Notes (Signed)
Pt here for STD screening.  Gram stain results reviewed.  Medication dispensed per Provider orders. Adam Sanjuan M Zuleyka Kloc, RN  

## 2021-07-22 ENCOUNTER — Encounter: Payer: Self-pay | Admitting: Physician Assistant

## 2021-07-22 NOTE — Progress Notes (Signed)
[facility] Department STI clinic/screening visit  Subjective:  Leonard Walsh is a 40 y.o. male being seen today for an STI screening visit. The patient reports they do have symptoms.    Patient has the following medical conditions:  There are no problems to display for this patient.    Chief Complaint  Patient presents with   SEXUALLY TRANSMITTED DISEASE    screening    HPI  Patient reports that he has had a white discharge for 3 weeks.  Denies other symptoms,chronic conditions and regular medicines.  Reports last HIV test was in June of this year and last void prior to sample collection for Gram stain was about 2 hr ago.    See flowsheet for further details and programmatic requirements.    The following portions of the patient's history were reviewed and updated as appropriate: allergies, current medications, past medical history, past social history, past surgical history and problem list.  Objective:  There were no vitals filed for this visit.  Physical Exam Constitutional:      General: He is not in acute distress.    Appearance: Normal appearance.  HENT:     Head: Normocephalic and atraumatic.     Comments: No nits,lice, or hair loss. No cervical, supraclavicular or axillary adenopathy.     Mouth/Throat:     Mouth: Mucous membranes are moist.     Pharynx: Oropharynx is clear. No oropharyngeal exudate or posterior oropharyngeal erythema.  Eyes:     Conjunctiva/sclera: Conjunctivae normal.  Pulmonary:     Effort: Pulmonary effort is normal.  Abdominal:     Palpations: Abdomen is soft. There is no mass.     Tenderness: There is no abdominal tenderness. There is no guarding or rebound.  Genitourinary:    Penis: Normal.      Testes: Normal.     Comments: Pubic area without nits, lice, hair loss, edema, erythema, lesions and inguinal adenopathy. Penis circumcised without rash or lesions. Small amount of dried discharge at meatus. Testicles  descended bilaterally,nt, no masses or edema.  Musculoskeletal:     Cervical back: Neck supple. No tenderness.  Skin:    General: Skin is warm and dry.     Findings: No bruising, erythema, lesion or rash.  Neurological:     Mental Status: He is alert and oriented to person, place, and time.  Psychiatric:        Mood and Affect: Mood normal.        Behavior: Behavior normal.        Thought Content: Thought content normal.        Judgment: Judgment normal.      Assessment and Plan:  Leonard Walsh is a 40 y.o. male presenting to the [facility] Department for STI screening  1. Screening for STD (sexually transmitted disease) Patient into clinic with symptoms. Patient declines blood work today. Reviewed Gram stain results. Rec condoms with all sex. Await test results.  Counseled that RN will call if needs to RTC for treatment once results are back.  - Gram stain - Gonococcus culture  2. Prophylactic measure Will cover for possible Trich with Metronidazole 2 g po with food, no EtOH for 24 hr before and until 72 hr after completing medicine. No sex for 10 days and until after partner completes treatment.  Call with questions or concerns.  - metroNIDAZOLE (FLAGYL) 500 MG tablet; Take 4 tablets (2,000 mg total) by mouth once for 1 dose.  Dispense: 4 tablet; Refill: 0  3. NGU (nongonococcal urethritis) Treat NGU based on symptoms and exam findings with Doxycycline 100 mg #14 1 po BID for 7 days. No sex for 14 days and until after partner completes treatment. Call with questions or concerns.  - doxycycline (VIBRA-TABS) 100 MG tablet; Take 1 tablet (100 mg total) by mouth 2 (two) times daily for 7 days.  Dispense: 14 tablet; Refill: 0     No follow-ups on file.  No future appointments.  Matt Holmes, PA

## 2021-07-24 LAB — GONOCOCCUS CULTURE
